# Patient Record
Sex: Male | Born: 1995 | Race: White | Hispanic: No | Marital: Single | Smoking: Never smoker
Health system: Southern US, Community
[De-identification: ages and names within clinical notes are randomized; demographics above are authoritative.]

## PROBLEM LIST (undated history)

## (undated) DIAGNOSIS — S43006A Unspecified dislocation of unspecified shoulder joint, initial encounter: Secondary | ICD-10-CM

## (undated) HISTORY — PX: SHOULDER SURGERY: SHX246

---
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
## 2014-10-11 IMAGING — CR DG SHOULDER 2+V*L*
3 series · 3 of 3 positions shown · non-contrast
Comparison: DG SHOULDER*L* dated 07/22/2013

CLINICAL DATA: Left shoulder dislocation.

EXAM:
LEFT SHOULDER - 2+ VIEW

[view not recorded (1 of 3)]
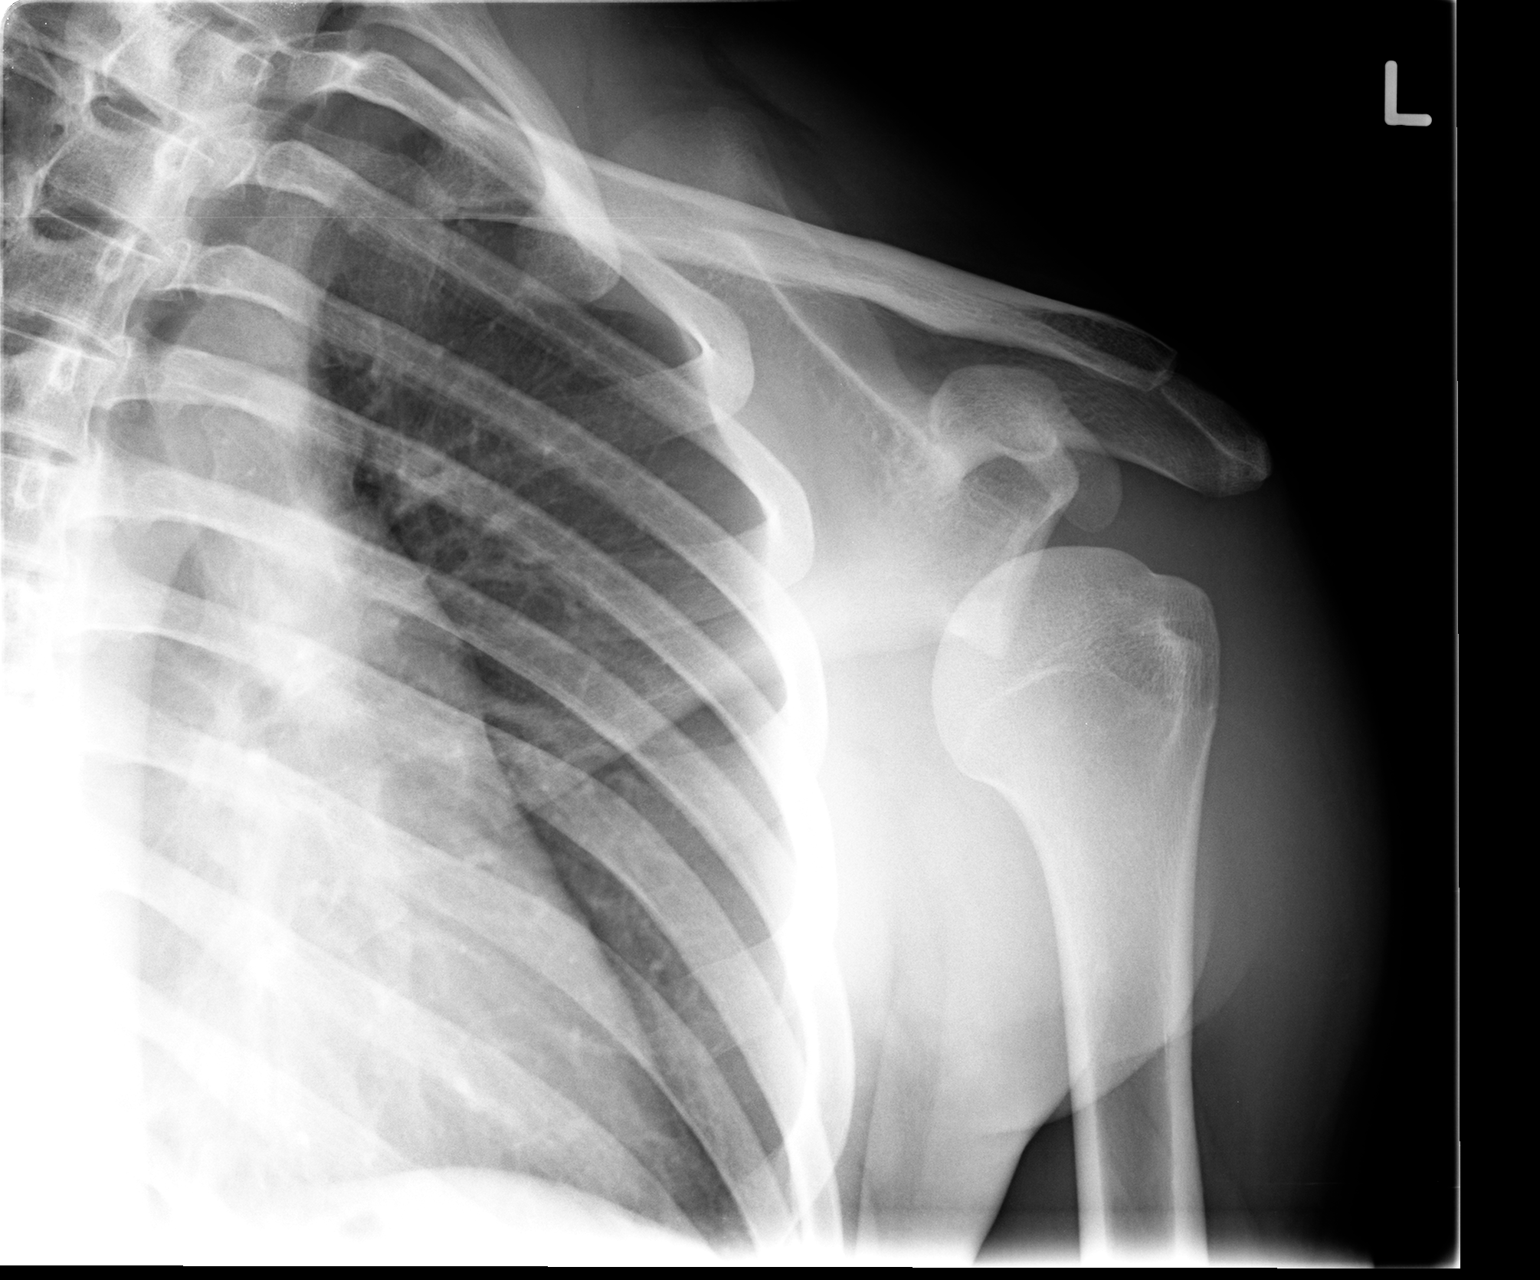

[view not recorded (2 of 3)]
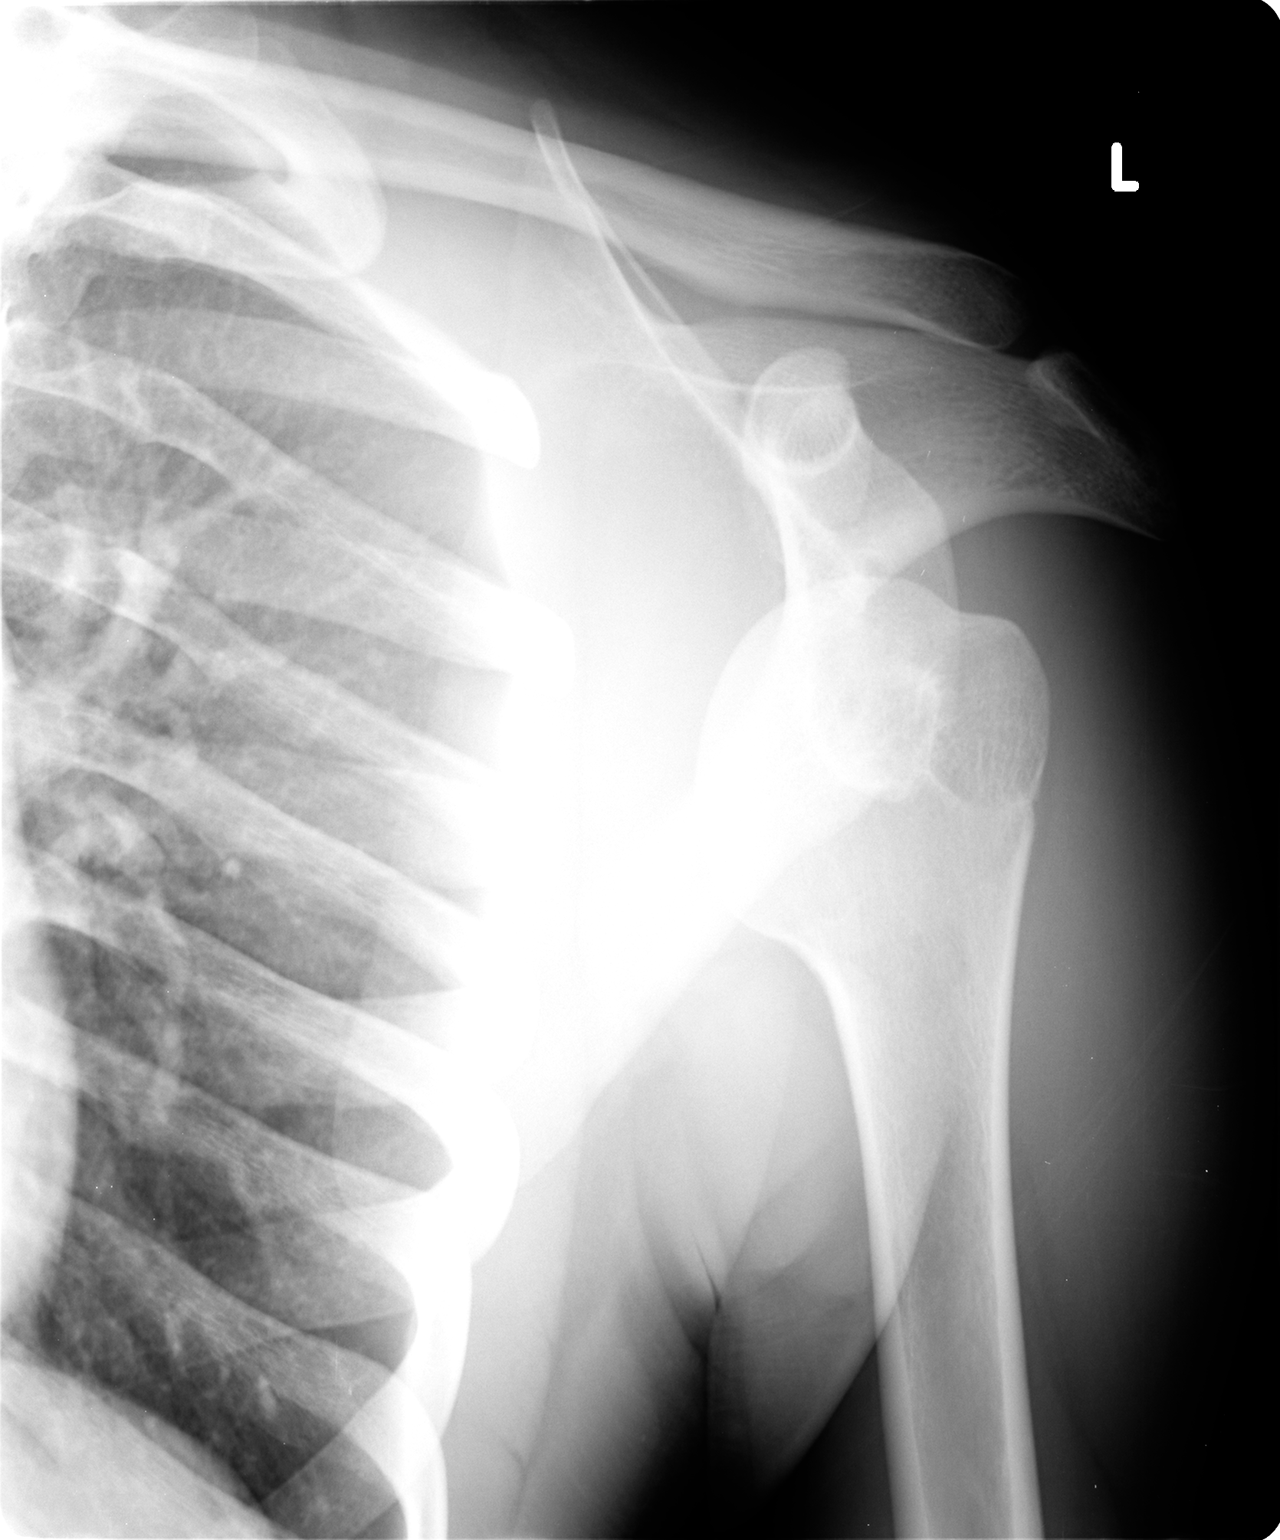

[view not recorded (3 of 3)]
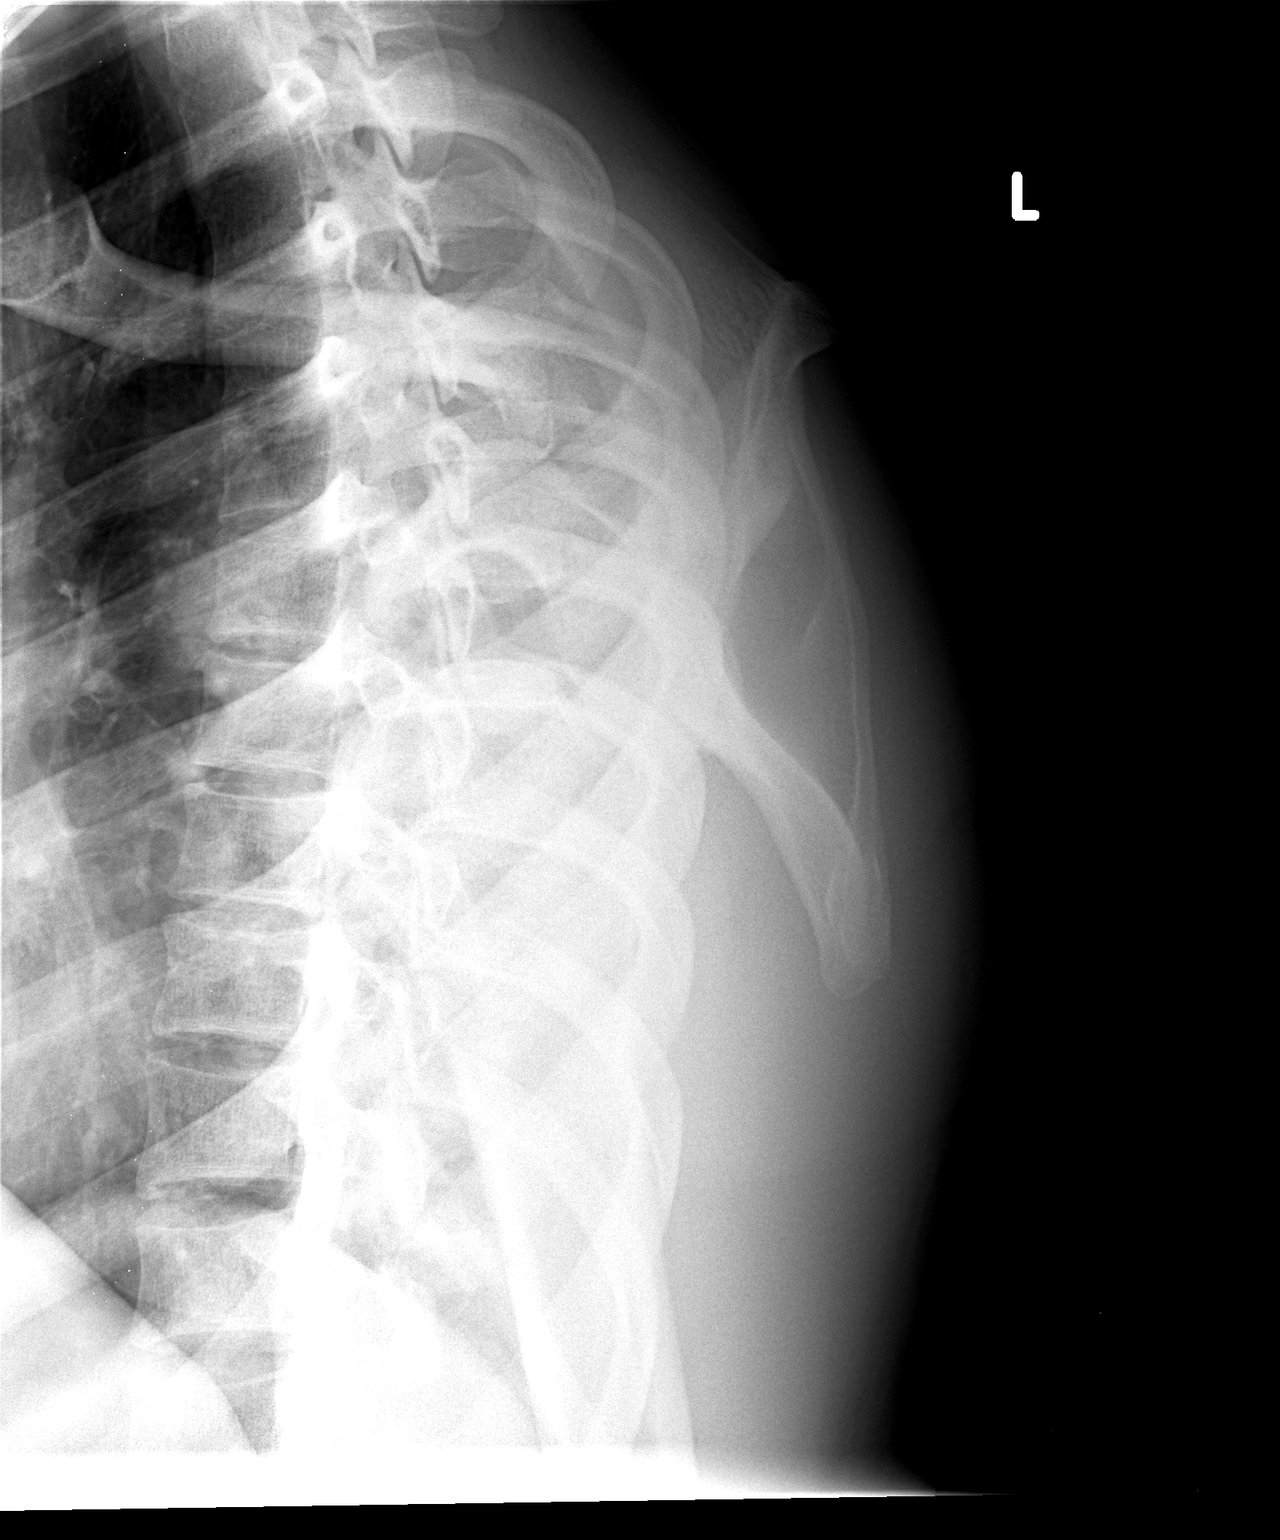

[3 of 3 positions shown; findings below may reference images not displayed]

FINDINGS: Anterior dislocation of the left shoulder, similar to findings on
prior study. No fractures identified. Coracoclavicular and
acromioclavicular spaces appear maintained.
IMPRESSION: Anterior dislocation of the left shoulder.

## 2021-06-11 DEATH — deceased
